# Patient Record
Sex: Female | Born: 1955 | Race: Black or African American | Hispanic: No | Marital: Single | Smoking: Never smoker
Health system: Southern US, Community
[De-identification: ages and names within clinical notes are randomized; demographics above are authoritative.]

## PROBLEM LIST (undated history)

## (undated) DIAGNOSIS — I10 Essential (primary) hypertension: Secondary | ICD-10-CM

---

## 2017-01-22 ENCOUNTER — Emergency Department (HOSPITAL_COMMUNITY)
Admission: EM | Admit: 2017-01-22 | Discharge: 2017-01-22 | Disposition: A | Discharge status: Home / Self Care | Payer: Self-pay | Attending: Emergency Medicine | Admitting: Emergency Medicine

## 2017-01-22 ENCOUNTER — Encounter (HOSPITAL_COMMUNITY): Payer: Self-pay | Admitting: Emergency Medicine

## 2017-01-22 ENCOUNTER — Emergency Department (HOSPITAL_COMMUNITY): Payer: Self-pay

## 2017-01-22 ENCOUNTER — Other Ambulatory Visit: Payer: Self-pay

## 2017-01-22 DIAGNOSIS — I10 Essential (primary) hypertension: Secondary | ICD-10-CM | POA: Insufficient documentation

## 2017-01-22 DIAGNOSIS — R2242 Localized swelling, mass and lump, left lower limb: Secondary | ICD-10-CM | POA: Insufficient documentation

## 2017-01-22 DIAGNOSIS — M7989 Other specified soft tissue disorders: Secondary | ICD-10-CM

## 2017-01-22 HISTORY — DX: Essential (primary) hypertension: I10

## 2017-01-22 MED ORDER — ONDANSETRON 4 MG PO TBDP
ORAL_TABLET | ORAL | Status: AC
  Administered 2017-01-22: 4 mg
  Filled 2017-01-22: qty 1

## 2017-01-22 MED ORDER — ONDANSETRON HCL 4 MG/2ML IJ SOLN: 4.0000 mg | Freq: Once | INTRAMUSCULAR | Status: DC

## 2017-01-22 MED ORDER — OXYCODONE-ACETAMINOPHEN 5-325 MG PO TABS
1.0000 | ORAL_TABLET | Freq: Once | ORAL | Status: AC
  Administered 2017-01-22: 1 via ORAL
  Filled 2017-01-22: qty 1

## 2017-01-22 MED ORDER — ENOXAPARIN SODIUM 80 MG/0.8ML ~~LOC~~ SOLN
75.0000 mg | Freq: Once | SUBCUTANEOUS | Status: AC
  Administered 2017-01-22: 75 mg via SUBCUTANEOUS
  Filled 2017-01-22: qty 0.8

## 2017-01-22 NOTE — ED Triage Notes (Signed)
Patient with left ankle swelling and pain.  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  She does not remember injuring the ankle but there is some marked swelling with the pain.  She states that she is having a hard time walking on the ankle.

## 2017-01-22 NOTE — Progress Notes (Signed)
Orthopedic Tech Progress Note Patient Details:  Paige PortelaSynthia Butler 12/22/1955 [id number]  Ortho Devices Type of Ortho Device: Crutches Ortho Device/Splint Interventions: Ordered, Adjustment   Post Interventions Patient Tolerated: Well Instructions Provided: Care of device   Jennye MoccasinHughes, Tamanika Heiney Craig 01/22/2017, 10:30 PM

## 2017-01-22 NOTE — ED Notes (Signed)
Ortho tech paged  

## 2017-01-22 NOTE — Discharge Instructions (Signed)
You have numbness or tingling in an arm or leg. You have shortness of breath. You have chest pain. You have a rapid or irregular heartbeat. You feel light-headed or dizzy. You cough up blood. There is blood in your vomit, stool, or urine.

## 2017-01-22 NOTE — ED Provider Notes (Signed)
[facility] EMERGENCY DEPARTMENT Provider Note   CSN: [id number] Arrival date & time: 01/22/17  2037     History   Chief Complaint Chief Complaint  Patient presents with  . 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  Patient states that she noticed she was having some swelling and tenderness in the left lower extremity that had continued to worsen to the point where she had difficulty ambulating on the left foot.  She denies any injury.  She has pain into her calf.  She denies of history of any DVTs or PEs.  She denies chest pain or shortness of breath.  HPI  Past Medical History:  Diagnosis Date  . Hypertension     There are no active problems to display for this patient.   History reviewed. No pertinent surgical history.  OB History    No data available       Home Medications    Prior to Admission medications   Not on File    Family History No family history on file.  Social History Social History   Tobacco Use  . Smoking status: Never Smoker  . Smokeless tobacco: Never Used  Substance Use Topics  . Alcohol use: Yes  . Drug use: No     Allergies   Patient has no known allergies.   Review of Systems Review of Systems  Ten systems reviewed and are negative for acute change, except as noted in the HPI.   Physical Exam Updated Vital Signs BP (!) 149/104 (BP Location: Right Arm)   Pulse 67   Temp 98.2 F (36.8 C) (Oral)   Resp 14   SpO2 100%   Physical Exam  Constitutional: She is oriented to person, place, and time. She appears well-developed and well-nourished. No distress.  HENT:  Head: Normocephalic and atraumatic.  Eyes: Conjunctivae are normal. No scleral icterus.  Neck: Normal range of motion.  Cardiovascular: Normal rate, regular rhythm and normal heart sounds. Exam reveals no gallop and no friction rub.    No murmur heard. Pulmonary/Chest: Effort normal and breath sounds normal. No respiratory distress.  Abdominal: Soft. Bowel sounds are normal. She exhibits no distension and no mass. There is no tenderness. There is no guarding.  Musculoskeletal:  Full range of motion of the left ankle.  There is pitting edema in the foot with redness and swelling into the left calf, normal DP and PT pulses.  Neurological: She is alert and oriented to person, place, and time.  Skin: Skin is warm and dry. She is not diaphoretic.  Psychiatric: Her behavior is normal.  Nursing note and vitals reviewed.    ED Treatments / Results  Labs (all labs ordered are listed, but only abnormal results are displayed) Labs Reviewed - No data to display  EKG  EKG Interpretation None       Radiology Dg Ankle Complete Left  Result Date: 01/22/2017 CLINICAL DATA:  Pain and swelling EXAM: LEFT ANKLE COMPLETE - 3+ VIEW COMPARISON:  None. FINDINGS: No fracture or malalignment. Ankle mortise is symmetric. Diffuse soft tissue swelling IMPRESSION: No acute osseous abnormality Electronically Signed   By: Jasmine Pang M.D.   On: 01/22/2017 21:37   Dg Foot Complete Left  Result Date: 01/22/2017 CLINICAL DATA:  Pain and swelling EXAM: LEFT FOOT - COMPLETE 3+ VIEW COMPARISON:  None. FINDINGS: No fracture or malalignment.  Mild degenerative changes at the first MTP joint. Mild soft tissue swelling IMPRESSION: No acute osseous abnormality Electronically Signed   By: Jasmine PangKim  Fujinaga M.D.   On: 01/22/2017 21:36    Procedures Procedures (including critical care time)  Medications Ordered in ED Medications  enoxaparin (LOVENOX) injection 1 mg/kg (not administered)     Initial Impression / Assessment and Plan / ED Course  I have reviewed the triage vital signs and the nursing notes.  Pertinent labs & imaging results that were available during my care of the patient were reviewed by me and considered in my medical decision  making (see chart for details).     Patient with negative foot and ankle x-rays. Concern for possible DVT.  Patient will be given Lovenox.  She is instructed to return in the morning.  She has no shortness of breath or chest pain.  Final Clinical Impressions(s) / ED Diagnoses   Final diagnoses:  Swelling of left lower extremity    ED Discharge Orders        Ordered    LE VENOUS     01/22/17 2220       Arthor CaptainHarris, Amarea Macdowell, PA-C 01/22/17 2228    Jacalyn LefevreHaviland, Julie, MD 01/22/17 2230

## 2017-01-23 ENCOUNTER — Emergency Department (HOSPITAL_COMMUNITY)
Admission: EM | Admit: 2017-01-23 | Discharge: 2017-01-23 | Disposition: A | Discharge status: Home / Self Care | Payer: Self-pay | Attending: Emergency Medicine | Admitting: Emergency Medicine

## 2017-01-23 ENCOUNTER — Other Ambulatory Visit: Payer: Self-pay

## 2017-01-23 ENCOUNTER — Encounter (HOSPITAL_COMMUNITY): Payer: Self-pay | Admitting: Emergency Medicine

## 2017-01-23 ENCOUNTER — Ambulatory Visit (HOSPITAL_COMMUNITY)
Admission: RE | Admit: 2017-01-23 | Discharge: 2017-01-23 | Disposition: A | Discharge status: Home / Self Care | Payer: Self-pay | Source: Ambulatory Visit | Attending: Emergency Medicine | Admitting: Emergency Medicine

## 2017-01-23 DIAGNOSIS — M79609 Pain in unspecified limb: Secondary | ICD-10-CM

## 2017-01-23 DIAGNOSIS — M7989 Other specified soft tissue disorders: Secondary | ICD-10-CM | POA: Insufficient documentation

## 2017-01-23 DIAGNOSIS — M79605 Pain in left leg: Secondary | ICD-10-CM | POA: Insufficient documentation

## 2017-01-23 DIAGNOSIS — M79672 Pain in left foot: Secondary | ICD-10-CM | POA: Insufficient documentation

## 2017-01-23 DIAGNOSIS — I10 Essential (primary) hypertension: Secondary | ICD-10-CM | POA: Insufficient documentation

## 2017-01-23 MED ORDER — ONDANSETRON 4 MG PO TBDP
4.0000 mg | ORAL_TABLET | Freq: Once | ORAL | Status: AC
  Administered 2017-01-23: 4 mg via ORAL
  Filled 2017-01-23: qty 1

## 2017-01-23 MED ORDER — ONDANSETRON HCL 4 MG PO TABS: 4.0000 mg | ORAL_TABLET | Freq: Once | ORAL | Status: DC

## 2017-01-23 MED ORDER — OXYCODONE-ACETAMINOPHEN 5-325 MG PO TABS
1.0000 | ORAL_TABLET | Freq: Once | ORAL | Status: AC
  Administered 2017-01-23: 1 via ORAL
  Filled 2017-01-23: qty 1

## 2017-01-23 NOTE — Discharge Instructions (Signed)
You do not have a clot in the left leg or foot.  The x-rays of your left foot and ankle show no fracture or dislocation.  Please use Tylenol and ibuprofen as needed for pain.  Follow-up with your primary doctor in [place] for further evaluation of your foot pain.  Return to the emergency department if you develop fever, worsening pain and swelling in the foot or have any new or concerning symptoms.

## 2017-01-23 NOTE — ED Notes (Signed)
Patient returned to have ultrasound.  See provider for note one touch patient. States last time given pain medication and was given Zofran and requested the same.

## 2017-01-23 NOTE — ED Provider Notes (Signed)
[facility]ENC[facility] Provider Note   CSN: [id number] Arrival date & time: 01/23/17  [id number]     History   Chief Complaint Chief Complaint  Patient presents with  . Foot Pain    HPI Paige PortelaSynthia Butler is a 62 y.o. female.  HPI   Ms. Paige Butler is a 62yo female with a history of hypertension who presents to the emergency department for recheck after her venous ultrasound study today.  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  X-rays of the ankle and foot were negative for acute fracture or abnormality.  She was subsequently given Lovenox last night and told to come back today for left venous ultrasound study to check for DVT.  The DVT study today is negative.  The patient reports that her left ankle and foot swelling has improved since yesterday.  Her pain is also improved.  At this time she states her pain is about a 6/10 in severity.  She was given crutches which have helped her ambulate, as the pain seems to be worse when walking.  She denies numbness, weakness, fever, chills.  She denies recent injury.  States that she is flying back to her hometown to [place] and plans to follow-up with her PCP when she gets home.  Past Medical History:  Diagnosis Date  . Hypertension     There are no active problems to display for this patient.   History reviewed. No pertinent surgical history.  OB History    No data available       Home Medications    Prior to Admission medications   Not on File    Family History No family history on file.  Social History Social History   Tobacco Use  . Smoking status: Never Smoker  . Smokeless tobacco: Never Used  Substance Use Topics  . Alcohol use: Yes  . Drug use: No     Allergies   Patient has no known allergies.   Review of Systems Review of Systems  Constitutional: Negative for chills and fever.  Musculoskeletal:  Positive for arthralgias (left foot) and joint swelling (left foot).  Skin: Negative for wound.  Neurological: Negative for weakness and numbness.     Physical Exam Updated Vital Signs BP (!) 134/93 (BP Location: Left Arm)   Pulse 62   Temp 98.1 F (36.7 C) (Oral)   Resp 16   SpO2 99%   Physical Exam  Constitutional: She is oriented to person, place, and time. She appears well-developed and well-nourished. No distress.  HENT:  Head: Normocephalic and atraumatic.  Eyes: Right eye exhibits no discharge. Left eye exhibits no discharge.  Pulmonary/Chest: Effort normal. No respiratory distress.  Musculoskeletal:  Left dorsum of the foot with mild swelling. No break in skin, warmth, erythema or ecchymosis. No swelling or edema appreciated in the calf. No calf tenderness. Full range of motion of the left ankle. DP and PT pulses 2+ bilaterally. Sensation to light touch intact in distal bilateral LE.   Neurological: She is alert and oriented to person, place, and time. Coordination normal.  Skin: Skin is warm and dry. Capillary refill takes less than 2 seconds. She is not diaphoretic.  Psychiatric: She has a normal mood and affect. Her behavior is normal.  Nursing note and vitals reviewed.    ED Treatments / Results  Labs (all labs ordered are listed, but only abnormal results are displayed) Labs  Reviewed - No data to display  EKG  EKG Interpretation None       Radiology Dg Ankle Complete Left  Result Date: 01/22/2017 CLINICAL DATA:  Pain and swelling EXAM: LEFT ANKLE COMPLETE - 3+ VIEW COMPARISON:  None. FINDINGS: No fracture or malalignment. Ankle mortise is symmetric. Diffuse soft tissue swelling IMPRESSION: No acute osseous abnormality Electronically Signed   By: Jasmine Pang M.D.   On: 01/22/2017 21:37   Dg Foot Complete Left  Result Date: 01/22/2017 CLINICAL DATA:  Pain and swelling EXAM: LEFT FOOT - COMPLETE 3+ VIEW COMPARISON:  None. FINDINGS: No fracture or  malalignment. Mild degenerative changes at the first MTP joint. Mild soft tissue swelling IMPRESSION: No acute osseous abnormality Electronically Signed   By: Jasmine Pang M.D.   On: 01/22/2017 21:36    Procedures Procedures (including critical care time)  Medications Ordered in ED Medications - No data to display   Initial Impression / Assessment and Plan / ED Course  I have reviewed the triage vital signs and the nursing notes.  Pertinent labs & imaging results that were available during my care of the patient were reviewed by me and considered in my medical decision making (see chart for details).    Patient presents for recheck following DVT ultrasound study today.  Venous ultrasound negative for DVT.  X-ray of left ankle and foot taken yesterday were negative.  Patient reports her swelling and pain has improved since she was last seen.  The foot is neurovascularly intact on exam. No warmth, erythema or signs of infection. Full ROM, exam non-concerning for septic arthritis.  Her vital signs stable.  Have counseled her to follow-up with her primary doctor for further evaluation when she returns home to [place].  Discussed NSAIDs and Tylenol for pain. Counseled patient on return precautions and she agrees and voiced understanding.  She has no complaints prior to discharge. Discussed this patient with Dr. Tomma Lightning who agrees with above plan to discharge.   Final Clinical Impressions(s) / ED Diagnoses   Final diagnoses:  Foot pain, left    ED Discharge Orders    None       Lawrence Marseilles 01/23/17 0930    Alvira Monday, MD 01/23/17 [phone]

## 2017-01-23 NOTE — Progress Notes (Signed)
*  PRELIMINARY RESULTS* Vascular Ultrasound Left lower extremity venous duplex has been completed.  Preliminary findings: No evidence of deep vein thrombosis or baker's cyst on the left.   Paige DonningCharlotte C Davison Ohms 01/23/2017, 8:30 AM

## 2017-01-23 NOTE — ED Triage Notes (Signed)
Pt reports being seen last night for L foot pain, reports cleared for DVT.  Pt requesting pain pill before catching flight back to [place].

## 2018-11-19 IMAGING — DX DG FOOT COMPLETE 3+V*L*
3 series · 3 of 3 positions shown · non-contrast
Comparison: None.

CLINICAL DATA: Pain and swelling

EXAM:
LEFT FOOT - COMPLETE 3+ VIEW

[foot ap]
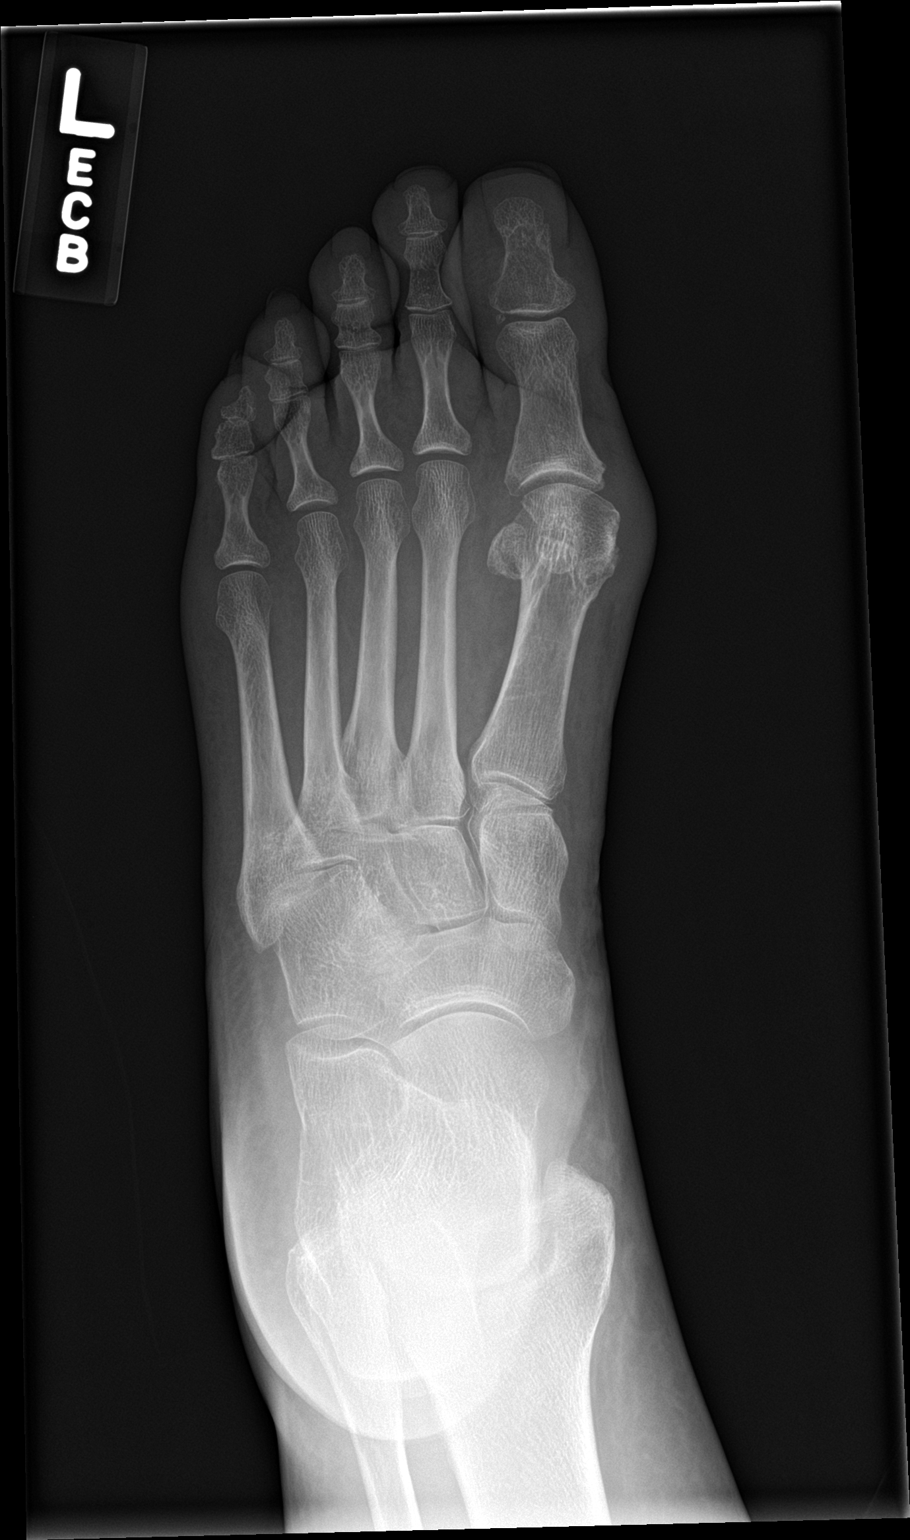

[foot obl]
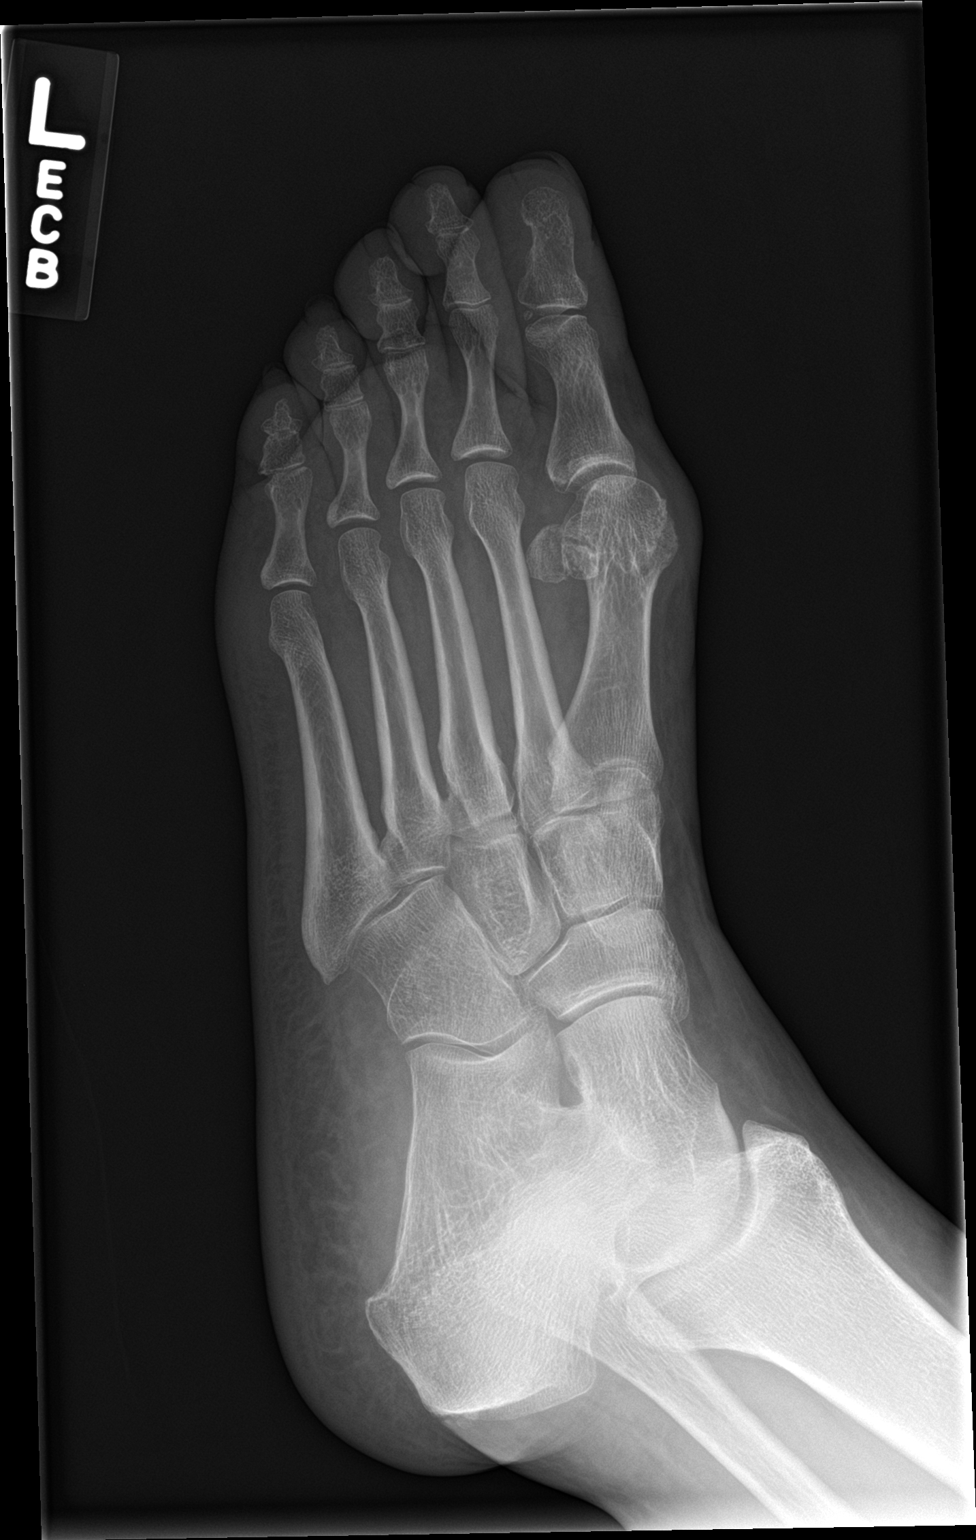

[foot lat]
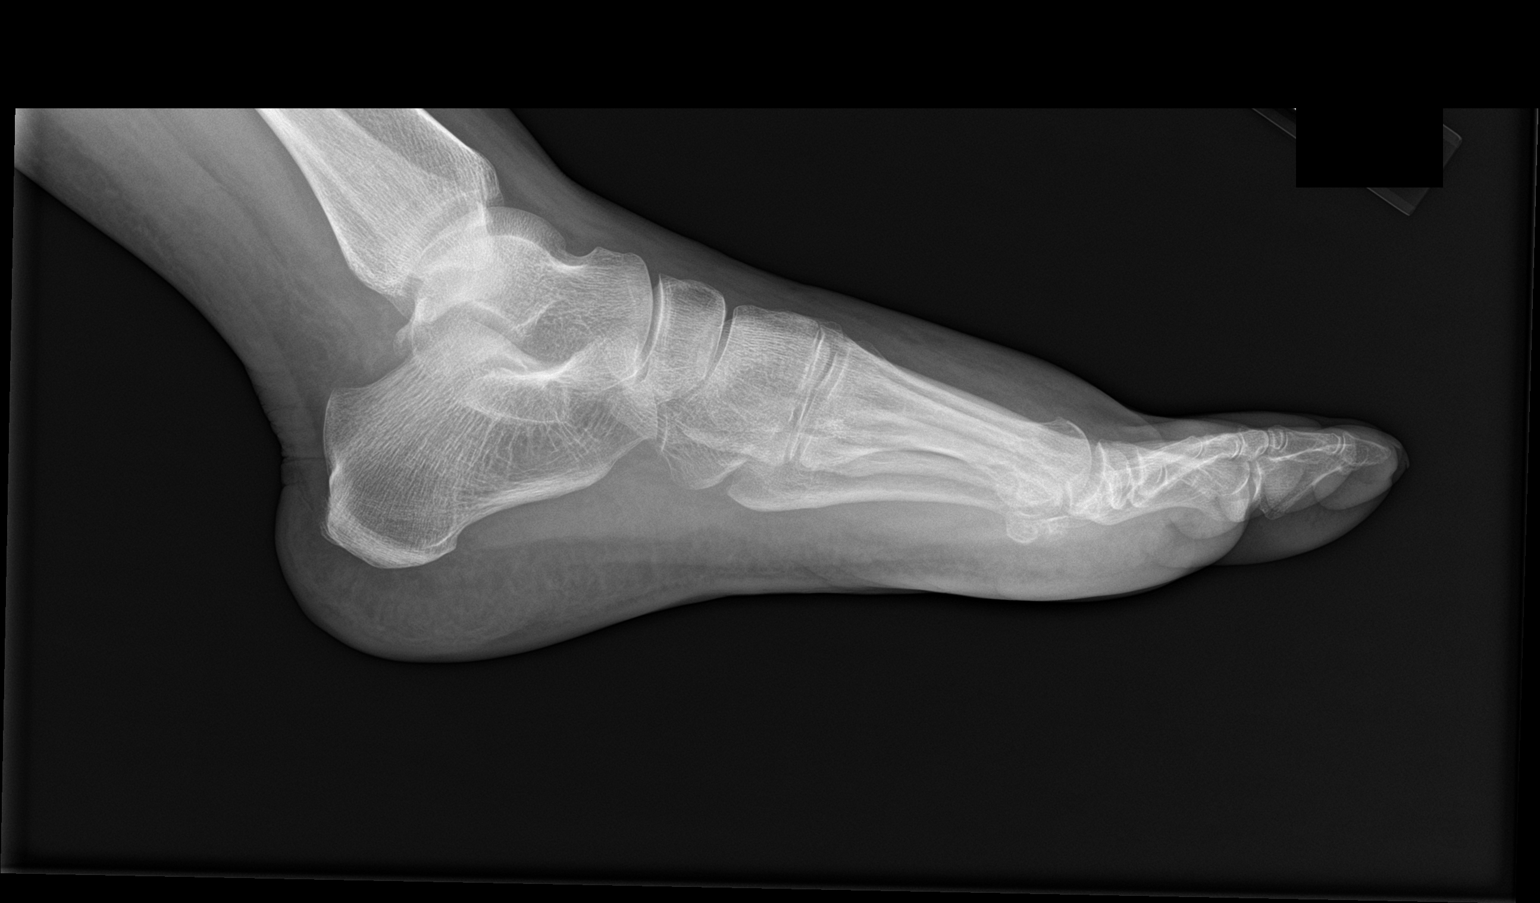

[3 of 3 positions shown; findings below may reference images not displayed]

FINDINGS: No fracture or malalignment. Mild degenerative changes at the first
MTP joint. Mild soft tissue swelling
IMPRESSION: No acute osseous abnormality
